# Patient Record
Sex: Male | Born: 1970 | Race: White | Hispanic: No | Marital: Single | Smoking: Never smoker
Health system: Southern US, Community
[De-identification: ages and names within clinical notes are randomized; demographics above are authoritative.]

## PROBLEM LIST (undated history)

## (undated) DIAGNOSIS — E119 Type 2 diabetes mellitus without complications: Secondary | ICD-10-CM

## (undated) DIAGNOSIS — F1990 Other psychoactive substance use, unspecified, uncomplicated: Secondary | ICD-10-CM

## (undated) DIAGNOSIS — I219 Acute myocardial infarction, unspecified: Secondary | ICD-10-CM

---

## 2019-09-18 ENCOUNTER — Emergency Department (HOSPITAL_COMMUNITY)
Admission: EM | Admit: 2019-09-18 | Discharge: 2019-09-18 | Disposition: A | Discharge status: Home / Self Care | Payer: Self-pay | Attending: Emergency Medicine | Admitting: Emergency Medicine

## 2019-09-18 ENCOUNTER — Other Ambulatory Visit: Payer: Self-pay

## 2019-09-18 ENCOUNTER — Emergency Department (HOSPITAL_COMMUNITY): Payer: Self-pay

## 2019-09-18 ENCOUNTER — Encounter (HOSPITAL_COMMUNITY): Payer: Self-pay | Admitting: Emergency Medicine

## 2019-09-18 DIAGNOSIS — Z590 Homelessness: Secondary | ICD-10-CM | POA: Insufficient documentation

## 2019-09-18 DIAGNOSIS — Z23 Encounter for immunization: Secondary | ICD-10-CM | POA: Insufficient documentation

## 2019-09-18 DIAGNOSIS — I96 Gangrene, not elsewhere classified: Secondary | ICD-10-CM | POA: Insufficient documentation

## 2019-09-18 DIAGNOSIS — Z7984 Long term (current) use of oral hypoglycemic drugs: Secondary | ICD-10-CM | POA: Insufficient documentation

## 2019-09-18 DIAGNOSIS — E119 Type 2 diabetes mellitus without complications: Secondary | ICD-10-CM | POA: Insufficient documentation

## 2019-09-18 DIAGNOSIS — Z89421 Acquired absence of other right toe(s): Secondary | ICD-10-CM | POA: Insufficient documentation

## 2019-09-18 HISTORY — DX: Other psychoactive substance use, unspecified, uncomplicated: F19.90

## 2019-09-18 LAB — COMPREHENSIVE METABOLIC PANEL
ALT: 20 U/L (ref 0–44)
AST: 19 U/L (ref 15–41)
Albumin: 3.3 g/dL — ABNORMAL LOW (ref 3.5–5.0)
Alkaline Phosphatase: 88 U/L (ref 38–126)
Anion gap: 10 (ref 5–15)
BUN: 9 mg/dL (ref 6–20)
CO2: 24 mmol/L (ref 22–32)
Calcium: 9.2 mg/dL (ref 8.9–10.3)
Chloride: 103 mmol/L (ref 98–111)
Creatinine, Ser: 0.71 mg/dL (ref 0.61–1.24)
GFR calc Af Amer: >60 mL/min (ref >60)
GFR calc non Af Amer: >60 mL/min (ref >60)
Glucose, Bld: 154 mg/dL — ABNORMAL HIGH (ref 70–99)
Potassium: 3.9 mmol/L (ref 3.5–5.1)
Sodium: 137 mmol/L (ref 135–145)
Total Bilirubin: 0.6 mg/dL (ref 0.3–1.2)
Total Protein: 6.3 g/dL — ABNORMAL LOW (ref 6.5–8.1)

## 2019-09-18 LAB — CBC WITH DIFFERENTIAL/PLATELET
Abs Immature Granulocytes: 0.09 10*3/uL — ABNORMAL HIGH (ref 0.00–0.07)
Basophils Absolute: 0.1 10*3/uL (ref 0.0–0.1)
Basophils Relative: 1 %
Eosinophils Absolute: 0.3 10*3/uL (ref 0.0–0.5)
Eosinophils Relative: 3 %
HCT: 42.2 % (ref 39.0–52.0)
Hemoglobin: 14.2 g/dL (ref 13.0–17.0)
Immature Granulocytes: 1 %
Lymphocytes Relative: 22 %
Lymphs Abs: 2.4 10*3/uL (ref 0.7–4.0)
MCH: 31.8 pg (ref 26.0–34.0)
MCHC: 33.6 g/dL (ref 30.0–36.0)
MCV: 94.6 fL (ref 80.0–100.0)
Monocytes Absolute: 0.5 10*3/uL (ref 0.1–1.0)
Monocytes Relative: 5 %
Neutro Abs: 7.3 10*3/uL (ref 1.7–7.7)
Neutrophils Relative %: 68 %
Platelets: 468 10*3/uL — ABNORMAL HIGH (ref 150–400)
RBC: 4.46 MIL/uL (ref 4.22–5.81)
RDW: 13 % (ref 11.5–15.5)
WBC: 10.7 10*3/uL — ABNORMAL HIGH (ref 4.0–10.5)
nRBC: 0 % (ref 0.0–0.2)

## 2019-09-18 LAB — LACTIC ACID, PLASMA: Lactic Acid, Venous: 2 mmol/L (ref 0.5–1.9)

## 2019-09-18 LAB — CBG MONITORING, ED: Glucose-Capillary: 160 mg/dL — ABNORMAL HIGH (ref 70–99)

## 2019-09-18 LAB — SEDIMENTATION RATE: Sed Rate: 30 mm/hr — ABNORMAL HIGH (ref 0–16)

## 2019-09-18 LAB — C-REACTIVE PROTEIN: CRP: 1.1 mg/dL — ABNORMAL HIGH (ref <1.0)

## 2019-09-18 MED ORDER — TETANUS-DIPHTH-ACELL PERTUSSIS 5-2.5-18.5 LF-MCG/0.5 IM SUSP
0.5000 mL | Freq: Once | INTRAMUSCULAR | Status: AC
  Administered 2019-09-18: 0.5 mL via INTRAMUSCULAR
  Filled 2019-09-18: qty 0.5

## 2019-09-18 MED ORDER — ACETAMINOPHEN 500 MG PO TABS
1000.0000 mg | ORAL_TABLET | Freq: Once | ORAL | Status: AC
  Administered 2019-09-18: 16:00:00 1000 mg via ORAL
  Filled 2019-09-18: qty 2

## 2019-09-18 MED ORDER — LACTATED RINGERS IV BOLUS
1000.0000 mL | Freq: Once | INTRAVENOUS | Status: AC
  Administered 2019-09-18: 16:00:00 1000 mL via INTRAVENOUS

## 2019-09-18 MED ORDER — CLINDAMYCIN HCL 300 MG PO CAPS: 300.0000 mg | ORAL_CAPSULE | Freq: Three times a day (TID) | ORAL | 0 refills | Status: AC

## 2019-09-18 NOTE — ED Triage Notes (Signed)
Pt states he is homeless and complains of chronic sores on feet.  States he ripped nail off of L 2nd toe today and has toe pain.

## 2019-09-18 NOTE — Care Management (Signed)
ED CM noted noted consult contacted ED CSW it appears patient will be admitted to inpatient.

## 2019-09-18 NOTE — ED Provider Notes (Signed)
[facility] EMERGENCY DEPARTMENT Provider Note   CSN: [id number] Arrival date & time: 09/18/19  1408     History Chief Complaint  Patient presents with  . Toe Pain    Jerry Mack is a 49 y.o. male.  The history is provided by the patient.  Toe Pain This is a new problem. Episode onset: 1 week. The problem occurs constantly. The problem has not changed since onset.Pertinent negatives include no chest pain, no abdominal pain, no headaches and no shortness of breath. Exacerbated by: direct palpation. Nothing relieves the symptoms. He has tried nothing for the symptoms.  The patient is a 49 year old male with a past medical history of diabetes, reported mild heart attacks in the past, hyperlipidemia who presents to the ED for right toe pain.  Patient reports that over the last week his right second and third toes have begun turning black and became painful.  The patient also took off his sock today and the second toenail was torn off which prompted him to present to the emergency department.  He reports that he is homeless and has been living on the streets, has not been able to get into a shelter.  He reports he has a remote history of drug abuse but has not used in 16 years, he denies any history of arrhythmias and he states he was formerly told that he had mild heart attacks but has never had any stents or other cardiac interventions performed.  Aside from the problems with his toes he denies any fevers, chills, nausea, vomiting, diarrhea, chest pain, shortness of breath, palpitations.     Past Medical History:  Diagnosis Date  . Drug use     There are no problems to display for this patient.   History reviewed. No pertinent surgical history.     No family history on file.  Social History   Tobacco Use  . Smoking status: Not on file  . Smokeless tobacco: Never Used  Substance Use Topics  . Alcohol use: Yes  . Drug use: Yes    Comment: 16 years  clean- request no narcotics    Home Medications Prior to Admission medications   Medication Sig Start Date End Date Taking? Authorizing Provider  clindamycin (CLEOCIN) 300 MG capsule Take 1 capsule (300 mg total) by mouth 3 (three) times daily for 7 days. 09/18/19 09/25/19  Bayyinah Dukeman, [place], Jerry    Allergies    Patient has no allergy information on record.  Review of Systems   Review of Systems  Respiratory: Negative for shortness of breath.   Cardiovascular: Negative for chest pain.  Gastrointestinal: Negative for abdominal pain.  Neurological: Negative for headaches.  All other systems reviewed and are negative.   Physical Exam Updated Vital Signs BP 121/72   Pulse 87   Temp 98.2 F (36.8 C) (Oral)   Resp 19   Ht '5\' 7"'  (1.702 m)   Wt 86.2 kg   SpO2 98%   BMI 29.76 kg/m   Physical Exam Vitals and nursing note reviewed.  Constitutional:      Appearance: He is well-developed.  HENT:     Head: Normocephalic and atraumatic.     Mouth/Throat:     Mouth: Mucous membranes are moist.     Pharynx: Oropharynx is clear.  Eyes:     Conjunctiva/sclera: Conjunctivae normal.  Cardiovascular:     Rate and Rhythm: Normal rate and regular rhythm.     Heart sounds: No murmur.  Pulmonary:  Effort: Pulmonary effort is normal. No respiratory distress.     Breath sounds: Normal breath sounds.  Abdominal:     Palpations: Abdomen is soft.     Tenderness: There is no abdominal tenderness.  Musculoskeletal:     Cervical back: Neck supple.     Right lower leg: Edema present.     Left lower leg: Edema present.  Skin:    General: Skin is warm and dry.     Comments: Black eschar to second and third toes with surrounding erythema and TTP.  Right second toenail torn off.  Neurological:     Mental Status: He is alert.     Sensory: Sensory deficit (over necrotic areas to toes) present.     ED Results / Procedures / Treatments   Labs (all labs ordered are listed, but only abnormal  results are displayed) Labs Reviewed  CBC WITH DIFFERENTIAL/PLATELET - Abnormal; Notable for the following components:      Result Value   WBC 10.7 (*)    Platelets 468 (*)    Abs Immature Granulocytes 0.09 (*)    All other components within normal limits  COMPREHENSIVE METABOLIC PANEL - Abnormal; Notable for the following components:   Glucose, Bld 154 (*)    Total Protein 6.3 (*)    Albumin 3.3 (*)    All other components within normal limits  SEDIMENTATION RATE - Abnormal; Notable for the following components:   Sed Rate 30 (*)    All other components within normal limits  C-REACTIVE PROTEIN - Abnormal; Notable for the following components:   CRP 1.1 (*)    All other components within normal limits  LACTIC ACID, PLASMA - Abnormal; Notable for the following components:   Lactic Acid, Venous 2.0 (*)    All other components within normal limits  CBG MONITORING, ED - Abnormal; Notable for the following components:   Glucose-Capillary 160 (*)    All other components within normal limits    EKG None  Radiology DG Foot Complete Left  Result Date: 09/18/2019 CLINICAL DATA:  Foot wound. EXAM: LEFT FOOT - COMPLETE 3+ VIEW COMPARISON:  None. FINDINGS: There is no evidence of fracture or dislocation. A plantar calcaneal enthesophyte is noted. No radiopaque foreign body is seen. Soft tissues are unremarkable. IMPRESSION: No evidence of fracture or dislocation. No radiopaque foreign body seen. Electronically Signed   By: Zerita Boers M.D.   On: 09/18/2019 16:36    Procedures Procedures (including critical care time)  Medications Ordered in ED Medications  lactated ringers bolus 1,000 mL (0 mLs Intravenous Stopped 09/18/19 1825)  acetaminophen (TYLENOL) tablet 1,000 mg (1,000 mg Oral Given 09/18/19 1603)  Tdap (BOOSTRIX) injection 0.5 mL (0.5 mLs Intramuscular Given 09/18/19 1631)    ED Course  I have reviewed the triage vital signs and the nursing notes.  Pertinent labs & imaging  results that were available during my care of the patient were reviewed by me and considered in my medical decision making (see chart for details).    MDM Rules/Calculators/A&P                      49 year old male, past medical history of diabetes, previous substance abuse, homelessness who presents to the ED due to pain and black skin changes to his right second and third toes.  Patient is homeless, symptoms likely secondary to frostbite, exam consistent with dry gangrene.  He is hemodynamically stable, x-ray without evidence of osteomyelitis, normal CRP and only  minimally elevated ESR, low suspicion of acute infection at this time, and patient does not have evidence of sepsis.  Consult social work for evaluation and assistance with resources and those were provided to the patient.  Prescribed clindamycin for infection prophylaxis, will follow up with orthopedics and primary care provider.  History return precautions provided, instructed on follow-up, discharged in stable condition.  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

## 2019-09-18 NOTE — Progress Notes (Signed)
CSW received consult to address homelessness and medical needs. Patient reports he is here in [place] as he left [place] for a woman. Patient reports it did not go well and he is now currently homeless. Patient reports all of his belongings are in [place] and he is unable to access them to [place] (patient was evasive regarding why [place]). Patient reports he initially came from [place] as him and some of his friends were traveling for work. Patient reports this did not work out due to [employer].  CSW discussed homeless resources including access to transportation assistance, long term housing supports, and primary care providers that offer a sliding-fee scale. CSW noted patient requested information on DSS to assist in transitioning his benefits over and CSW provided patient with phone number and address. CSW provided information for [facility], [facility], [employer], [employer], and multiple medical providers. Please reach out to CSW if further assistance is needed.

## 2019-10-07 ENCOUNTER — Other Ambulatory Visit: Payer: Self-pay | Admitting: *Deleted

## 2019-10-07 DIAGNOSIS — Z20822 Contact with and (suspected) exposure to covid-19: Secondary | ICD-10-CM

## 2019-10-08 LAB — NOVEL CORONAVIRUS, NAA: SARS-CoV-2, NAA: NOT DETECTED

## 2019-10-10 ENCOUNTER — Encounter: Payer: Self-pay | Admitting: *Deleted

## 2019-10-10 NOTE — Congregational Nurse Program (Signed)
  Dept: [phone]   Congregational Nurse Program Note  Date of Encounter: 10/10/2019  Past Medical History: Past Medical History:  Diagnosis Date  . Drug use     Encounter Details: CNP Questionnaire - 10/10/19 1239      Questionnaire   Patient Status  Not Applicable    Race  White or Caucasian    Location Patient Served At  [employer]  Not Applicable    Uninsured  Uninsured (NEW 1x/quarter)    Food  No food insecurities    Housing/Utilities  No permanent housing    Transportation  No transportation needs    Interpersonal Safety  Yes, feel physically and emotionally safe where you currently live    Medication  Yes, have medication insecurities    Medical Provider  No   referred to Lavinia Sharps NP   Referrals  Behavioral/Mental Health Provider;Primary Care Provider/Clinic   [facility]   ED Visit Averted  Not Applicable    Life-Saving Intervention Made  Not Applicable      Pt at [facility] requesting help with referral for a mental health therapist and medication. Wrote referral for [facility]. Took vitals signs 140/98 and checked cbg 213. Pt says that he has took metformin and blood pressure medication in the past. He does not have a PCP. Referred to Lavinia Sharps NP at [facility] and scheduled appointment for 10/11/19 at 0900. Pt is currently staying outside [employer]. He says that he feels safe there and is currently working with a CM for other housing options. Waynetta Sandy RN [phone]

## 2019-11-09 ENCOUNTER — Encounter: Payer: Self-pay | Admitting: *Deleted

## 2019-11-09 ENCOUNTER — Telehealth: Payer: Self-pay | Admitting: *Deleted

## 2019-11-09 NOTE — Telephone Encounter (Signed)
[facility] Department and was told client could not get medications due to time lapse since prescription unless NP contacted them.

## 2019-11-09 NOTE — Congregational Nurse Program (Signed)
  Dept: [phone]   Congregational Nurse Program Note  Date of Encounter: 11/09/2019  Past Medical History: Past Medical History:  Diagnosis Date  . Drug use     Encounter Details: CNP Questionnaire - 11/09/19 1001      Questionnaire   Race  White or Caucasian    Location Patient Served At  [employer]   [facility]   Uninsured  Not Applicable    Food  No food insecurities    Housing/Utilities  No permanent housing   pt staying in motel with friend   Transportation  Yes, need transportation assistance    Interpersonal Safety  Yes, feel physically and emotionally safe where you currently live    Medication  Yes, have medication insecurities    Medical Provider  Yes    Referrals  Primary Care Provider/Clinic   Lavinia Sharps   ED Visit Averted  Not Applicable    Life-Saving Intervention Made  Not Applicable      Pt came to [facility] to do laundry and have vitals checked. Checked cbg 277. Pt did not pick up his medications after last appointment with Lavinia Sharps NP. Called Health Department and they could not fill medication due to time lapse since prescription unless NP approved. Talked with NP, client missed appointment with her yesterday. Referred back to NP as she may still be able to assess client with new insurance. Educated client on importance of medication and controlling diabetes. Educated client on foot care. Gave diabetic socks. Pt denies si and hi.

## 2019-11-22 ENCOUNTER — Emergency Department (HOSPITAL_COMMUNITY)
Admission: EM | Admit: 2019-11-22 | Discharge: 2019-11-22 | Disposition: A | Discharge status: Home / Self Care | Payer: 59 | Attending: Emergency Medicine | Admitting: Emergency Medicine

## 2019-11-22 ENCOUNTER — Other Ambulatory Visit: Payer: Self-pay

## 2019-11-22 ENCOUNTER — Emergency Department (HOSPITAL_COMMUNITY): Payer: 59

## 2019-11-22 ENCOUNTER — Encounter (HOSPITAL_COMMUNITY): Payer: Self-pay | Admitting: Emergency Medicine

## 2019-11-22 DIAGNOSIS — M25512 Pain in left shoulder: Secondary | ICD-10-CM | POA: Diagnosis present

## 2019-11-22 DIAGNOSIS — I252 Old myocardial infarction: Secondary | ICD-10-CM | POA: Insufficient documentation

## 2019-11-22 DIAGNOSIS — E119 Type 2 diabetes mellitus without complications: Secondary | ICD-10-CM | POA: Diagnosis not present

## 2019-11-22 DIAGNOSIS — M5412 Radiculopathy, cervical region: Secondary | ICD-10-CM | POA: Diagnosis not present

## 2019-11-22 HISTORY — DX: Acute myocardial infarction, unspecified: I21.9

## 2019-11-22 HISTORY — DX: Type 2 diabetes mellitus without complications: E11.9

## 2019-11-22 MED ORDER — LIDOCAINE 5 % EX PTCH: 1.0000 | MEDICATED_PATCH | CUTANEOUS | 0 refills | Status: AC

## 2019-11-22 MED ORDER — PREDNISONE 10 MG (21) PO TBPK: ORAL_TABLET | ORAL | 0 refills | Status: AC

## 2019-11-22 MED ORDER — METHOCARBAMOL 750 MG PO TABS: 750.0000 mg | ORAL_TABLET | Freq: Two times a day (BID) | ORAL | 0 refills | Status: AC | PRN

## 2019-11-22 NOTE — ED Triage Notes (Signed)
Pt endorses left shoulder pain for a year and half.

## 2019-11-22 NOTE — Discharge Instructions (Addendum)
  Take it easy, but do not lay around too much as this may make any stiffness worse.  Antiinflammatory medications: Take 600 mg of ibuprofen every 6 hours or 440 mg (over the counter dose) to 500 mg (prescription dose) of naproxen every 12 hours for the next 3 days. After this time, these medications may be used as needed for pain. Take these medications with food to avoid upset stomach. Choose only one of these medications, do not take them together. Acetaminophen (generic for Tylenol): Should you continue to have additional pain while taking the ibuprofen or naproxen, you may add in acetaminophen as needed. Your daily total maximum amount of acetaminophen from all sources should be limited to 4000mg /day for persons without liver problems, or 2000mg /day for those with liver problems. Methocarbamol: Methocarbamol (generic for Robaxin) is a muscle relaxer and can help relieve stiff muscles or muscle spasms.  Do not drive or perform other dangerous activities while taking this medication as it can cause drowsiness as well as changes in reaction time and judgement. Prednisone: Take the prednisone, as prescribed, until finished. If you are a diabetic, please know prednisone can raise your blood sugar temporarily. Lidocaine patches: These are available via either prescription or over-the-counter. The over-the-counter option may be more economical one and are likely just as effective. There are multiple over-the-counter brands, such as Salonpas. Ice: May apply ice to the area over the next 24 hours for 15 minutes at a time to reduce pain, inflammation, and swelling, if present. Follow up: Follow-up with the orthopedic specialist on this matter ideally within the next 2 weeks.  Call to make an appointment. Return: Return to the ED should symptoms worsen.  For prescription assistance, may try using prescription discount sites or apps, such as goodrx.com
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
## 2019-12-18 ENCOUNTER — Emergency Department (HOSPITAL_COMMUNITY)
Admission: EM | Admit: 2019-12-18 | Discharge: 2019-12-19 | Disposition: A | Discharge status: Home / Self Care | Payer: 59 | Attending: Emergency Medicine | Admitting: Emergency Medicine

## 2019-12-18 ENCOUNTER — Other Ambulatory Visit: Payer: Self-pay

## 2019-12-18 ENCOUNTER — Encounter (HOSPITAL_COMMUNITY): Payer: Self-pay | Admitting: Emergency Medicine

## 2019-12-18 DIAGNOSIS — L02811 Cutaneous abscess of head [any part, except face]: Secondary | ICD-10-CM | POA: Insufficient documentation

## 2019-12-18 DIAGNOSIS — Z7984 Long term (current) use of oral hypoglycemic drugs: Secondary | ICD-10-CM | POA: Diagnosis not present

## 2019-12-18 DIAGNOSIS — E119 Type 2 diabetes mellitus without complications: Secondary | ICD-10-CM | POA: Diagnosis not present

## 2019-12-18 DIAGNOSIS — R220 Localized swelling, mass and lump, head: Secondary | ICD-10-CM | POA: Diagnosis present

## 2019-12-18 NOTE — ED Triage Notes (Signed)
Patient reports scalp skin abscess with drainage onset yesterday , patient added skin redness at face , no fever/no oral swelling .

## 2019-12-19 MED ORDER — DOXYCYCLINE HYCLATE 100 MG PO TABS
100.0000 mg | ORAL_TABLET | Freq: Once | ORAL | Status: AC
  Administered 2019-12-19: 100 mg via ORAL
  Filled 2019-12-19: qty 1

## 2019-12-19 MED ORDER — DOXYCYCLINE HYCLATE 100 MG PO CAPS: 100.0000 mg | ORAL_CAPSULE | Freq: Two times a day (BID) | ORAL | 0 refills | Status: AC

## 2019-12-19 MED ORDER — DOXYCYCLINE HYCLATE 100 MG PO CAPS: 100.0000 mg | ORAL_CAPSULE | Freq: Two times a day (BID) | ORAL | 0 refills | Status: DC

## 2019-12-19 MED ORDER — LIDOCAINE-EPINEPHRINE (PF) 2 %-1:200000 IJ SOLN
10.0000 mL | Freq: Once | INTRAMUSCULAR | Status: AC
  Administered 2019-12-19: 10 mL
  Filled 2019-12-19: qty 20

## 2019-12-19 NOTE — ED Provider Notes (Signed)
[facility] EMERGENCY DEPARTMENT Provider Note   CSN: [id number] Arrival date & time: 12/18/19  2211     History Chief Complaint  Patient presents with  . Scalp Skin Abscess    Jerry Mack is a 49 y.o. male.  Patient to ED with complaint of painful sore that developed in the scalp 2 days ago after accidentally running into a tree branch. He reports the wound is now open and draining. Today he noticed facial redness and swelling. No fever, nausea. No facial pain.   The history is provided by the patient. No language interpreter was used.       Past Medical History:  Diagnosis Date  . Diabetes mellitus without complication (HCC)   . Drug use   . Heart attack (HCC)     There are no problems to display for this patient.   History reviewed. No pertinent surgical history.     No family history on file.  Social History   Tobacco Use  . Smoking status: Never Smoker  . Smokeless tobacco: Never Used  Substance Use Topics  . Alcohol use: Yes  . 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 Authorizing Provider  lidocaine (LIDODERM) 5 % Place 1 patch onto the skin daily. Remove & Discard patch within 12 hours or as directed by MD 11/22/19   Joy, Hillard Danker, PA-C  methocarbamol (ROBAXIN) 750 MG tablet Take 1 tablet (750 mg total) by mouth 2 (two) times daily as needed for muscle spasms (or muscle tightness). 11/22/19   Joy, Shawn C, PA-C  predniSONE (STERAPRED UNI-PAK 21 TAB) 10 MG (21) TBPK tablet Take 6 tabs (60mg ) day 1, 5 tabs (50mg ) day 2, 4 tabs (40mg ) day 3, 3 tabs (30mg ) day 4, 2 tabs (20mg ) day 5, and 1 tab (10mg ) day 6. 11/22/19   Joy, Shawn C, PA-C    Allergies    Asa [aspirin] and Penicillins  Review of Systems   Review of Systems  Constitutional: Negative for fever.  HENT: Positive for facial swelling.        See HPI.    Gastrointestinal: Negative for nausea.  Skin: Positive for wound.  Neurological: Negative for headaches.    Physical Exam Updated Vital Signs BP (!) 147/94 (BP Location: Right Arm)   Pulse 92   Temp 98.6 F (37 C) (Oral)   Resp 16   Ht 5\' 7"  (1.702 m)   Wt 79.4 kg   SpO2 99%   BMI 27.41 kg/m   Physical Exam Vitals and nursing note reviewed.  Constitutional:      Appearance: He is well-developed.  Eyes:     Extraocular Movements: Extraocular movements intact.     Comments: Eyes with painfree, full ROM.   Pulmonary:     Effort: Pulmonary effort is normal.  Musculoskeletal:        General: Normal range of motion.     Cervical back: Normal range of motion.  Skin:    General: Skin is warm and dry.     Comments: Raised wound to parietal scalp with purulent drainage c/w abscess. There is surrounding edema extending to forehead. Periorbital swelling that is mildly erythematous but nontender.   Neurological:     Mental Status: He is alert and oriented to person, place, and time.     ED Results / Procedures / Treatments  Labs (all labs ordered are listed, but only abnormal results are displayed) Labs Reviewed - No data to display  EKG None  Radiology No results found.  Procedures .Marland KitchenIncision and Drainage  Date/Time: 12/19/2019 6:14 AM Performed by: Charlann Lange, PA-C Authorized by: Charlann Lange, PA-C   Consent:    Consent obtained:  Verbal   Consent given by:  Patient Location:    Type:  Abscess   Location:  Head   Head location:  Scalp Pre-procedure details:    Skin preparation:  Betadine Anesthesia (see MAR for exact dosages):    Anesthesia method:  Local infiltration   Local anesthetic:  Lidocaine 2% WITH epi Procedure type:    Complexity:  Complex Procedure details:    Needle aspiration: no     Incision types:  Single straight   Scalpel blade:  11   Wound management:  Probed and deloculated, irrigated with saline and debrided   Drainage:   Bloody and purulent   Drainage amount:  Scant   Wound treatment:  Wound left open   Packing materials:  1/4 in iodoform gauze   Amount 1/4" iodoform:  2 Post-procedure details:    Patient tolerance of procedure:  Tolerated well, no immediate complications   (including critical care time)  Medications Ordered in ED Medications - No data to display  ED Course  I have reviewed the triage vital signs and the nursing notes.  Pertinent labs & imaging results that were available during my care of the patient were reviewed by me and considered in my medical decision making (see chart for details).    MDM Rules/Calculators/A&P                      Patient to ED with abscess to scalp causing dependent facial edema. Do not suspect orbital cellulitis or periorbital infection.   Abscess I&D'd as per above note. A small strip of packing placed to prevent re-accumulation. Will start Doxycycline. He reports he has an already scheduled appointment with his doctor in 2 days.   Final Clinical Impression(s) / ED Diagnoses Final diagnoses:  None  1. Scalp abscess   Rx / DC Orders ED Discharge Orders    None       Dennie Bible 77/82/42 [id number]    Delora Fuel, MD 14/43/15 [phone]

## 2019-12-19 NOTE — Discharge Instructions (Addendum)
Keep your scheduled appointment with your doctor in 2 days for recheck. Take the antibiotic as prescribed for the next 10 days.   Return to the emergency department with any new or worsening symptoms.

## 2019-12-19 NOTE — ED Notes (Signed)
Called for vitals, no response x1 

## 2019-12-19 NOTE — ED Notes (Signed)
MD at bedside. 

## 2019-12-19 NOTE — ED Notes (Signed)
Pt called x3 to be brought back to room. No answer.

## 2020-10-26 DEATH — deceased

## 2021-09-11 IMAGING — CR DG SHOULDER 2+V*L*
3 series · 3 of 3 positions shown · non-contrast
Comparison: None.

CLINICAL DATA: Chronic pain

EXAM:
LEFT SHOULDER - 2+ VIEW

[shoulder grashey]
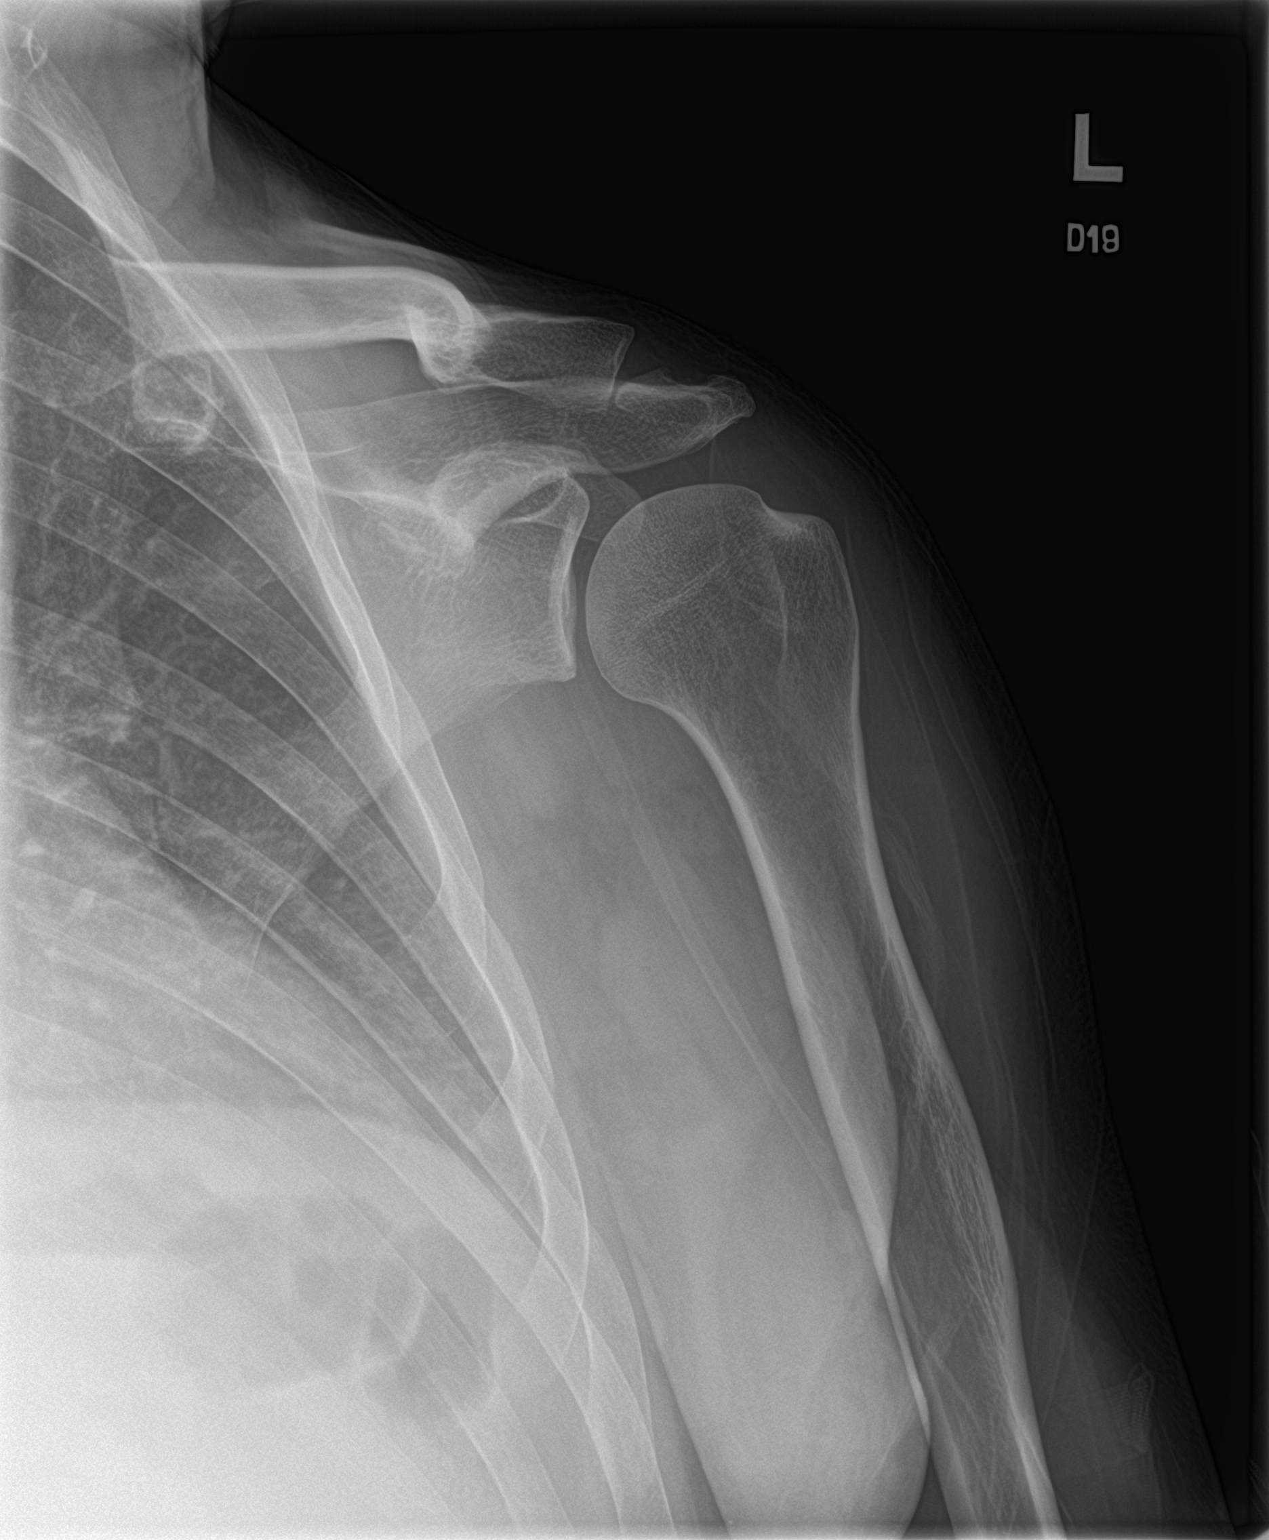

[shoulder y view]
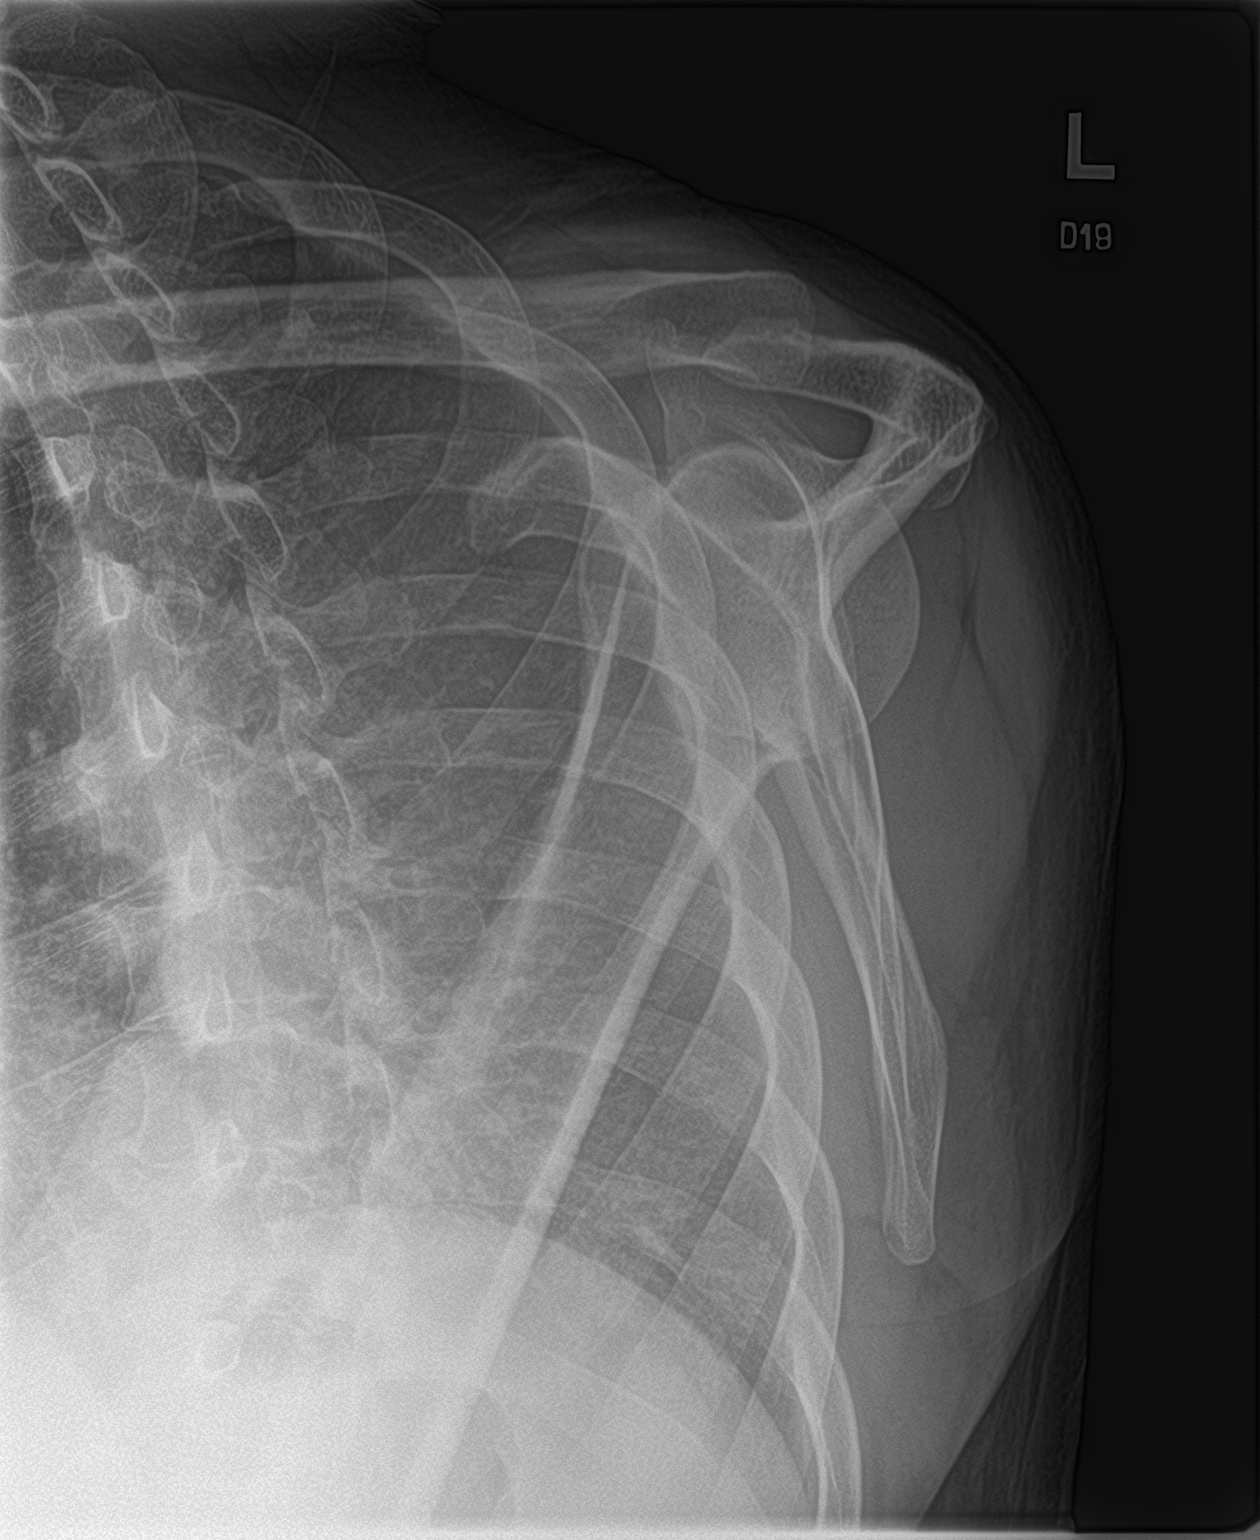

[shoulder ap neutral]
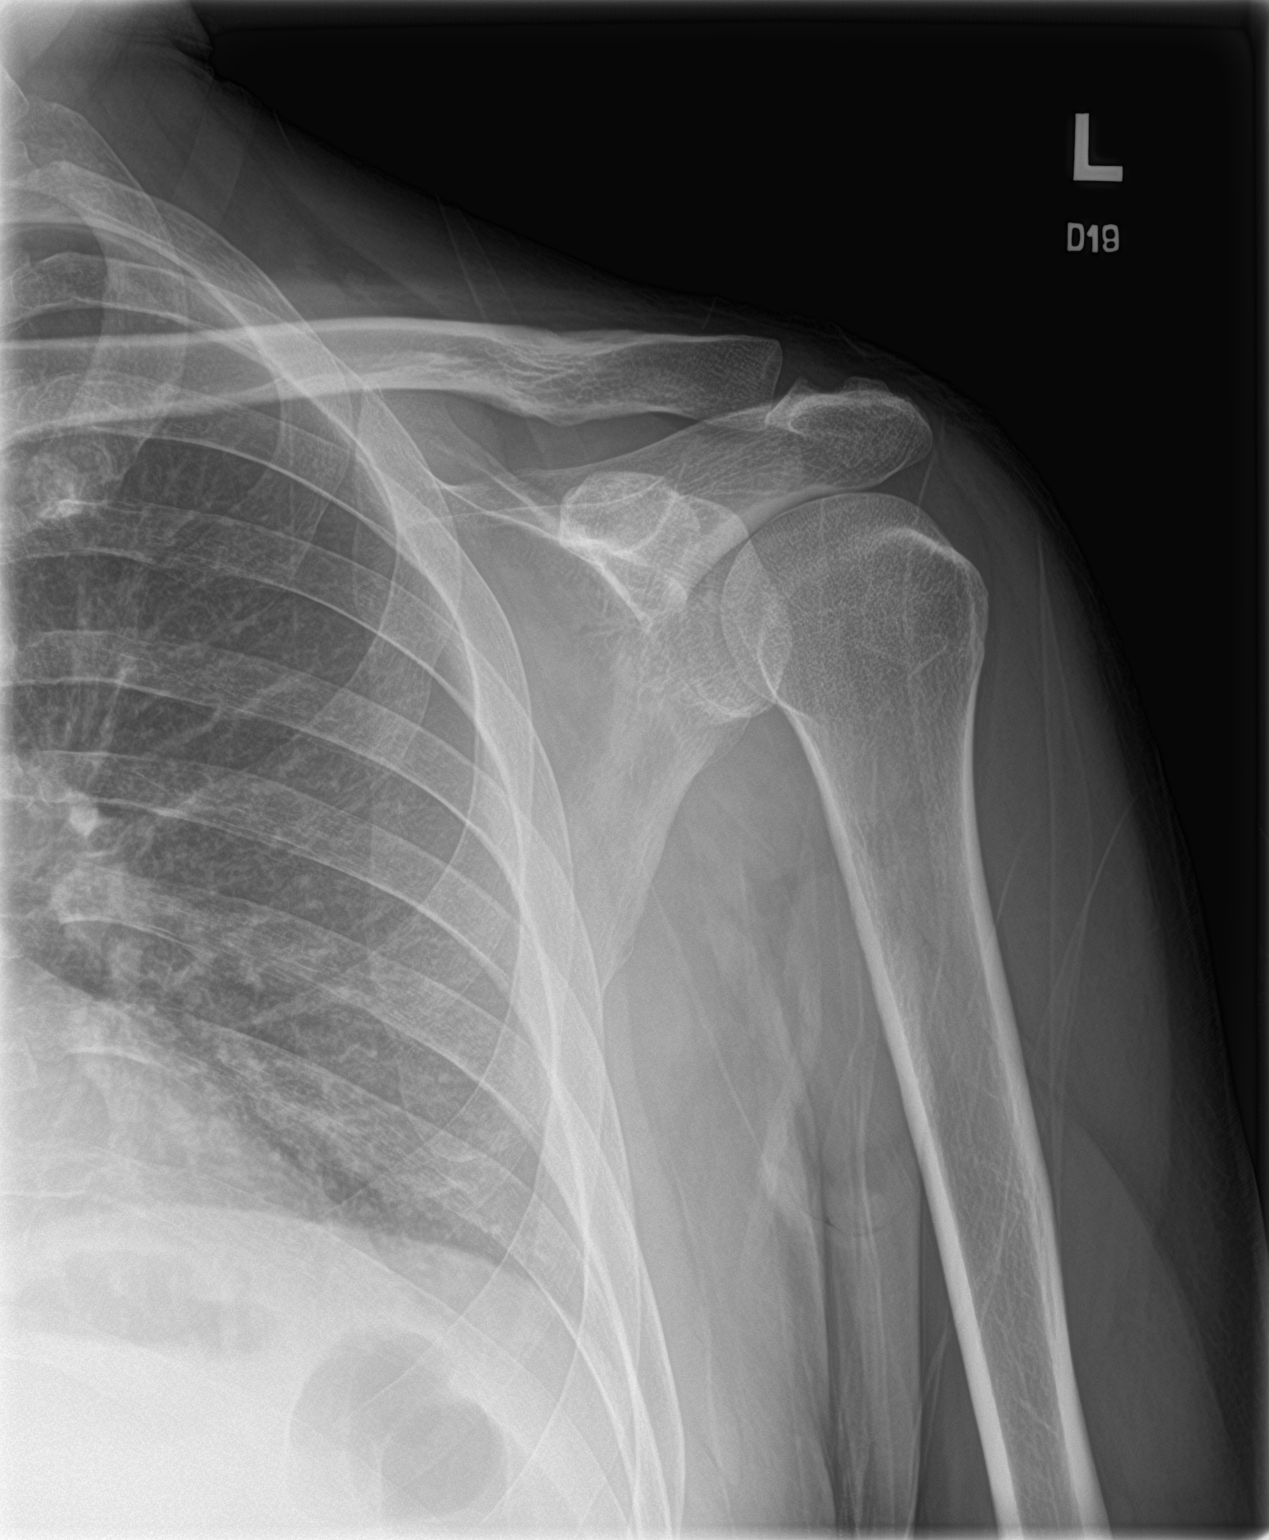

[3 of 3 positions shown; findings below may reference images not displayed]

FINDINGS: Frontal, oblique, and Y scapular images obtained. No fracture or
dislocation. Joint spaces appear normal. No erosive change or
intra-articular calcification. Visualized left lung clear.
IMPRESSION: No fracture or dislocation.  No evident arthropathy.
